# Patient Record
Sex: Male | Born: 1990 | Race: Black or African American | Hispanic: No | Marital: Single | Smoking: Current every day smoker
Health system: Southern US, Community
[De-identification: ages and names within clinical notes are randomized; demographics above are authoritative.]

---

## 2011-07-28 ENCOUNTER — Emergency Department (HOSPITAL_COMMUNITY)
Admission: EM | Admit: 2011-07-28 | Discharge: 2011-07-28 | Disposition: A | Discharge status: Home / Self Care | Payer: Self-pay | Attending: Emergency Medicine | Admitting: Emergency Medicine

## 2011-07-28 ENCOUNTER — Emergency Department (HOSPITAL_COMMUNITY): Payer: Self-pay

## 2011-07-28 DIAGNOSIS — Z23 Encounter for immunization: Secondary | ICD-10-CM | POA: Insufficient documentation

## 2011-07-28 DIAGNOSIS — S0180XA Unspecified open wound of other part of head, initial encounter: Secondary | ICD-10-CM | POA: Insufficient documentation

## 2011-07-28 DIAGNOSIS — R51 Headache: Secondary | ICD-10-CM | POA: Insufficient documentation

## 2012-12-19 ENCOUNTER — Emergency Department (HOSPITAL_COMMUNITY)
Admission: EM | Admit: 2012-12-19 | Discharge: 2012-12-19 | Disposition: A | Discharge status: Home / Self Care | Payer: Self-pay

## 2014-08-23 ENCOUNTER — Emergency Department (HOSPITAL_COMMUNITY): Payer: Managed Care, Other (non HMO)

## 2014-08-23 ENCOUNTER — Emergency Department (HOSPITAL_COMMUNITY)
Admission: EM | Admit: 2014-08-23 | Discharge: 2014-08-24 | Discharge status: Against Medical Advice | Payer: Managed Care, Other (non HMO) | Attending: Emergency Medicine | Admitting: Emergency Medicine

## 2014-08-23 ENCOUNTER — Encounter (HOSPITAL_COMMUNITY): Payer: Self-pay | Admitting: Emergency Medicine

## 2014-08-23 DIAGNOSIS — F17200 Nicotine dependence, unspecified, uncomplicated: Secondary | ICD-10-CM | POA: Insufficient documentation

## 2014-08-23 DIAGNOSIS — R42 Dizziness and giddiness: Secondary | ICD-10-CM | POA: Diagnosis not present

## 2014-08-23 DIAGNOSIS — R55 Syncope and collapse: Secondary | ICD-10-CM | POA: Diagnosis present

## 2014-08-23 LAB — I-STAT TROPONIN, ED: TROPONIN I, POC: 0 ng/mL (ref 0.00–0.08)

## 2014-08-23 LAB — CBC
HCT: 41.7 % (ref 39.0–52.0)
Hemoglobin: 13.8 g/dL (ref 13.0–17.0)
MCH: 24.5 pg — AB (ref 26.0–34.0)
MCHC: 33.1 g/dL (ref 30.0–36.0)
MCV: 74.1 fL — AB (ref 78.0–100.0)
PLATELETS: 253 10*3/uL (ref 150–400)
RBC: 5.63 MIL/uL (ref 4.22–5.81)
RDW: 13.9 % (ref 11.5–15.5)
WBC: 10 10*3/uL (ref 4.0–10.5)

## 2014-08-23 LAB — BASIC METABOLIC PANEL
ANION GAP: 15 (ref 5–15)
BUN: 12 mg/dL (ref 6–23)
CALCIUM: 10 mg/dL (ref 8.4–10.5)
CO2: 26 meq/L (ref 19–32)
CREATININE: 0.93 mg/dL (ref 0.50–1.35)
Chloride: 99 mEq/L (ref 96–112)
Glucose, Bld: 97 mg/dL (ref 70–99)
Potassium: 4.8 mEq/L (ref 3.7–5.3)
SODIUM: 140 meq/L (ref 137–147)

## 2014-08-23 NOTE — ED Notes (Signed)
Pt reports dizziness and near syncopal episode after smoking marijuana tonight.  States having sharp cp as well.  He states "i smoke weed so i don't think it's from something else."

## 2014-08-24 NOTE — ED Notes (Signed)
Patient called to treatment room, first call, no answer.

## 2014-08-24 NOTE — ED Notes (Signed)
Patient called to treatment room, 2nd call, no answer.  

## 2014-08-24 NOTE — ED Notes (Signed)
Called patient to treatment room, 3rd call, no answer. Patient being discharged [facility] after triage at this time.

## 2015-09-26 IMAGING — CR DG CHEST 2V
2 series · 2 of 2 positions shown · non-contrast
Comparison: None.

CLINICAL DATA: Cough and fever.  Near syncope.  Smoker.

EXAM:
CHEST  2 VIEW

[w chest pa]
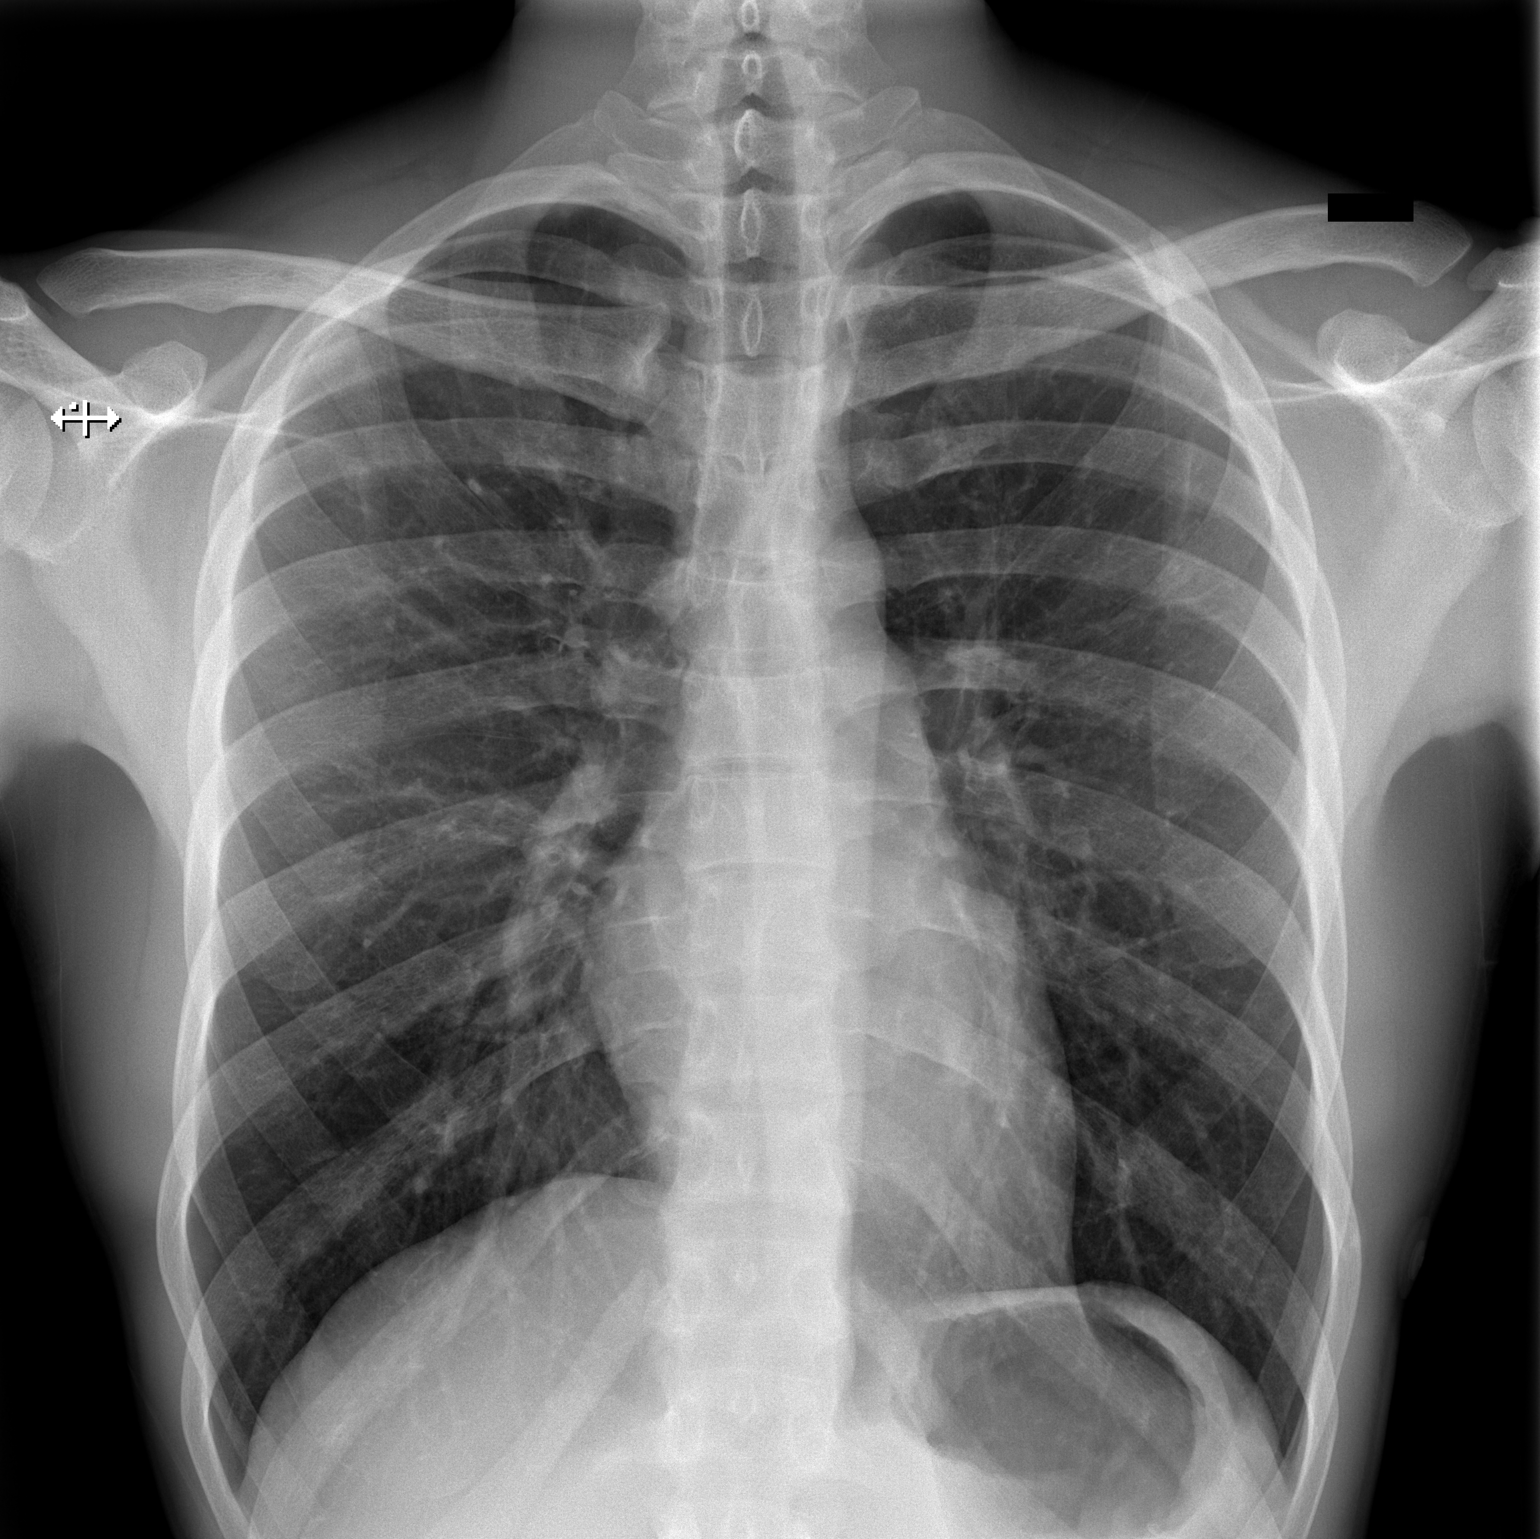

[w chest lat]
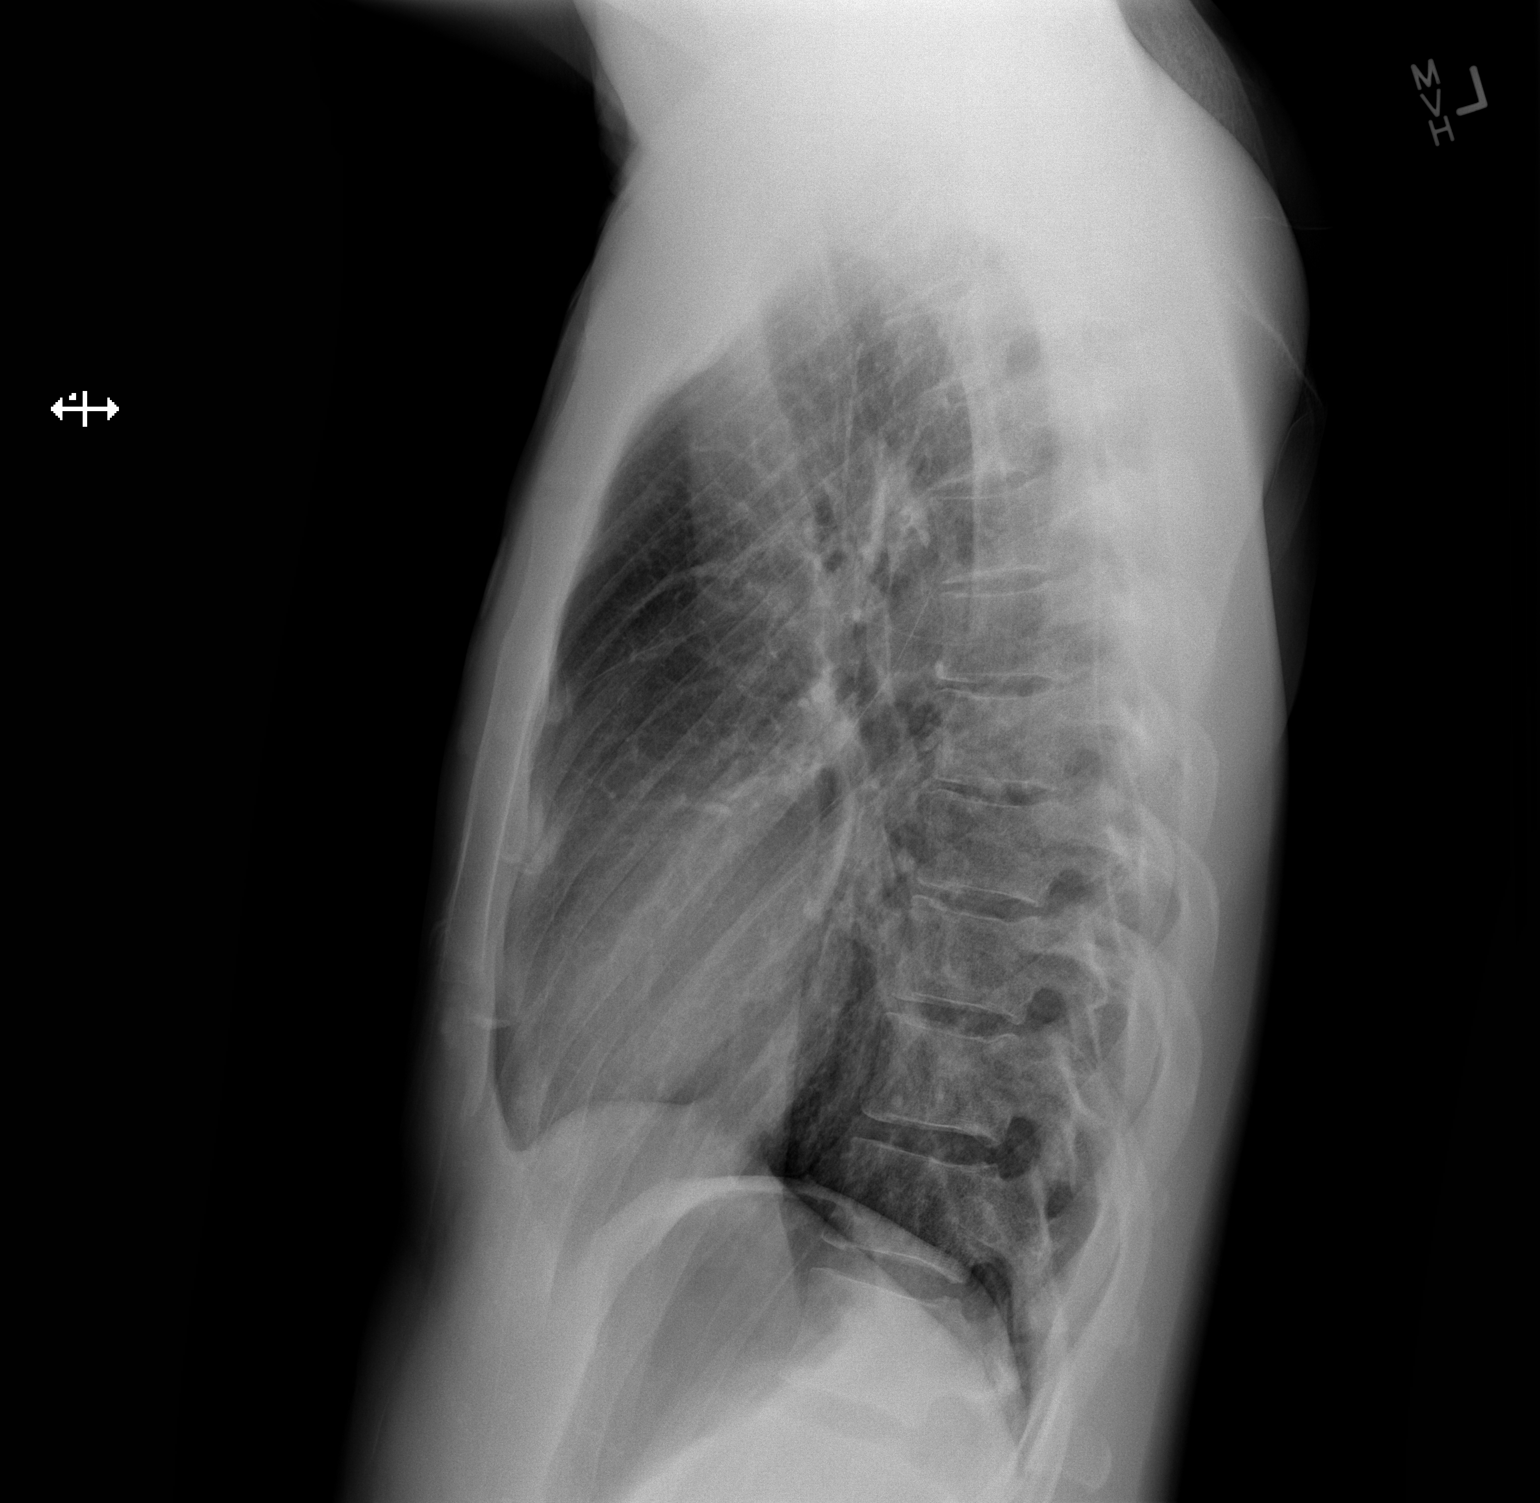

[2 of 2 positions shown; findings below may reference images not displayed]

FINDINGS: Lungs are well inflated without or effusion. Cardiomediastinal
silhouette is within normal. Bones and soft tissues are normal.
IMPRESSION: No active cardiopulmonary disease.
